# Patient Record
Sex: Male | Born: 2000 | Race: White | Hispanic: No | Marital: Single | Smoking: Never smoker
Health system: Southern US, Community
[De-identification: ages and names within clinical notes are randomized; demographics above are authoritative.]

---

## 2018-11-10 ENCOUNTER — Other Ambulatory Visit: Payer: Self-pay | Admitting: Internal Medicine

## 2018-11-10 DIAGNOSIS — Z20822 Contact with and (suspected) exposure to covid-19: Secondary | ICD-10-CM

## 2018-11-11 LAB — NOVEL CORONAVIRUS, NAA: SARS-CoV-2, NAA: NOT DETECTED

## 2018-12-21 ENCOUNTER — Other Ambulatory Visit: Payer: Self-pay

## 2018-12-21 DIAGNOSIS — Z20822 Contact with and (suspected) exposure to covid-19: Secondary | ICD-10-CM

## 2018-12-22 LAB — NOVEL CORONAVIRUS, NAA: SARS-CoV-2, NAA: NOT DETECTED

## 2019-12-06 ENCOUNTER — Encounter: Payer: Self-pay | Admitting: Emergency Medicine

## 2019-12-06 ENCOUNTER — Emergency Department: Payer: 59

## 2019-12-06 ENCOUNTER — Emergency Department
Admission: EM | Admit: 2019-12-06 | Discharge: 2019-12-06 | Disposition: A | Discharge status: Home / Self Care | Payer: 59 | Attending: Emergency Medicine | Admitting: Emergency Medicine

## 2019-12-06 ENCOUNTER — Other Ambulatory Visit: Payer: Self-pay

## 2019-12-06 DIAGNOSIS — X58XXXA Exposure to other specified factors, initial encounter: Secondary | ICD-10-CM | POA: Insufficient documentation

## 2019-12-06 DIAGNOSIS — S93402A Sprain of unspecified ligament of left ankle, initial encounter: Secondary | ICD-10-CM

## 2019-12-06 DIAGNOSIS — S99912A Unspecified injury of left ankle, initial encounter: Secondary | ICD-10-CM | POA: Diagnosis present

## 2019-12-06 MED ORDER — HYDROCODONE-ACETAMINOPHEN 5-325 MG PO TABS: 1.0000 | ORAL_TABLET | Freq: Four times a day (QID) | ORAL | 0 refills | Status: AC | PRN

## 2019-12-06 MED ORDER — HYDROCODONE-ACETAMINOPHEN 5-325 MG PO TABS
1.0000 | ORAL_TABLET | Freq: Once | ORAL | Status: AC
  Administered 2019-12-06: 1 via ORAL
  Filled 2019-12-06: qty 1

## 2019-12-06 NOTE — ED Notes (Signed)
See triage note  Presents with left ankle pain  States he did this yesterday   Was seen  Given Celebrex  But having increased pain  States he has an appt with Wonda Amis today

## 2019-12-06 NOTE — ED Provider Notes (Signed)
[facility] Emergency Department Provider Note  ____________________________________________   None    (approximate)  I have reviewed the triage vital signs and the nursing notes.   HISTORY  Chief Complaint Ankle Injury   HPI Nathan Hinton is a 19 y.o. male presents to the ED with complaint of left ankle pain.  Patient states that he was seen yesterday in [place] and diagnosed with a closed bimalleolar ankle fracture and prescribed Celebrex and Tylenol which has not given him any relief of his pain.  He walks with the aid of crutches and was put in a cam walker.  Patient denies any head injury or loss of consciousness during his injury.  This occurred while rollerblading.      History reviewed. No pertinent past medical history.  There are no problems to display for this patient.   History reviewed. No pertinent surgical history.  Prior to Admission medications   Medication Sig Start Date End Date Taking? Authorizing Provider  HYDROcodone-acetaminophen (NORCO/VICODIN) 5-325 MG tablet Take 1 tablet by mouth every 6 (six) hours as needed for moderate pain. 12/06/19   Tommi Rumps, PA-C    Allergies Patient has no known allergies.  No family history on file.  Social History Social History   Tobacco Use  . Smoking status: Never Smoker  . Smokeless tobacco: Never Used  Vaping Use  . Vaping Use: Never used  Substance Use Topics  . Alcohol use: Not on file  . Drug use: Not on file    Review of Systems Constitutional: No fever/chills Eyes: No visual changes. ENT: No sore throat. Cardiovascular: Denies chest pain. Respiratory: Denies shortness of breath. Gastrointestinal: No abdominal pain.  No nausea, no vomiting.  No diarrhea.  No constipation. Genitourinary: Negative for dysuria. Musculoskeletal: Negative for back pain. Skin: Negative for rash. Neurological: Negative for headaches, focal weakness or  numbness. ____________________________________________   PHYSICAL EXAM:  VITAL SIGNS: ED Triage Vitals  Enc Vitals Group     BP 12/06/19 0552 138/85     Pulse Rate 12/06/19 0552 80     Resp 12/06/19 0552 18     Temp 12/06/19 0552 98 F (36.7 C)     Temp Source 12/06/19 0552 Oral     SpO2 12/06/19 0552 98 %     Weight 12/06/19 0551 150 lb (68 kg)     Height 12/06/19 0551 5\' 11"  (1.803 m)     Head Circumference --      Peak Flow --      Pain Score 12/06/19 0552 8     Pain Loc --      Pain Edu? --      Excl. in GC? --     Constitutional: Alert and oriented. Well appearing and in no acute distress. Eyes: Conjunctivae are normal.  Head: Atraumatic. Neck: No stridor.   Cardiovascular: Normal rate, regular rhythm. Grossly normal heart sounds.  Good peripheral circulation. Respiratory: Normal respiratory effort.  No retractions. Lungs CTAB. Gastrointestinal: Soft and nontender. No distention. Musculoskeletal: On examination of the left ankle there is marked soft tissue edema bilaterally with ecchymosis.  Skin is intact.  Pulses present.  Motor sensory function intact distal to the injury.  Patient was placed in a cam walker which was removed for examination.  CAM Walker was replaced back on with instructions to ice and elevate.  Ice was applied to the ankle while waiting for x-rays. Neurologic:  Normal speech and language. No gross focal neurologic deficits are appreciated.  Skin:  Skin is warm, dry and intact.  Psychiatric: Mood and affect are normal. Speech and behavior are normal.  ____________________________________________   LABS (all labs ordered are listed, but only abnormal results are displayed)  Labs Reviewed - No data to display RADIOLOGY   Official radiology report(s): DG Ankle Complete Left  Result Date: 12/06/2019 CLINICAL DATA:  Left ankle injury. EXAM: LEFT ANKLE COMPLETE - 3+ VIEW COMPARISON:  No prior. FINDINGS: Diffuse soft tissue swelling. No acute bony  abnormality. No evidence of fracture dislocation. IMPRESSION: Diffuse soft tissue swelling. No acute bony abnormality. Electronically Signed   By: Maisie Fus  Register   On: 12/06/2019 07:32    ____________________________________________   PROCEDURES  Procedure(s) performed (including Critical Care):  Procedures   ____________________________________________   INITIAL IMPRESSION / ASSESSMENT AND PLAN / ED COURSE  As part of my medical decision making, I reviewed the following data within the electronic MEDICAL RECORD NUMBER Notes from prior ED visits and [place] Controlled Substance Database  19 year old male presents to the ED with concerns for pain medication as he was seen yesterday for an ankle injury.  He was told that he had a bimalleolar fracture and has an appointment today with [facility] in [place].  He is under the impression that he will be having surgery.  X-rays in the ED today did not show any fractures but moderate soft tissue edema.  While waiting for x-rays patient's ankle was elevated and ice was applied.  He was given hydrocodone with acetaminophen while waiting for the results as his mother was picking him up.  A prescription for hydrocodone was sent to the pharmacy.  He is encouraged to keep his appointment at [facility] today.  ____________________________________________   FINAL CLINICAL IMPRESSION(S) / ED DIAGNOSES  Final diagnoses:  Moderate ankle sprain, left, initial encounter     ED Discharge Orders         Ordered    HYDROcodone-acetaminophen (NORCO/VICODIN) 5-325 MG tablet  Every 6 hours PRN        12/06/19 0818          *Please note:  Nathan Hinton was evaluated in Emergency Department on 12/06/2019 for the symptoms described in the history of present illness. He was evaluated in the context of the global COVID-19 pandemic, which necessitated consideration that the patient might be at risk for infection with the SARS-CoV-2 virus that causes  COVID-19. Institutional protocols and algorithms that pertain to the evaluation of patients at risk for COVID-19 are in a state of rapid change based on information released by regulatory bodies including the CDC and federal and state organizations. These policies and algorithms were followed during the patient's care in the ED.  Some ED evaluations and interventions may be delayed as a result of limited staffing during and the pandemic.*   Note:  This document was prepared using Dragon voice recognition software and may include unintentional dictation errors.    Tommi Rumps, PA-C 12/06/19 1218    Sharyn Creamer, MD 12/06/19 [phone]

## 2019-12-06 NOTE — Discharge Instructions (Addendum)
Keep your appointment today with [facility].  Continue using your crutches when walking.  Ice and elevate frequently to decrease swelling which will help with pain.  Continue taking the Celebrex that was prescribed for you yesterday.  A prescription for hydrocodone was sent to your pharmacy.  This is 1 tablet every 6 hours as needed for pain.  Be aware that this medication could cause drowsiness and increase your risk for further injury.

## 2019-12-06 NOTE — ED Triage Notes (Addendum)
Pt ambulatory to triage with aid of crutches,no difficulty or distress noted; reports dx closed bimallleolar ankle fx yesterday and having pain unrelieved by rx celebrex and tylenol

## 2020-01-09 ENCOUNTER — Ambulatory Visit (INDEPENDENT_AMBULATORY_CARE_PROVIDER_SITE_OTHER): Payer: 59 | Admitting: Psychology

## 2020-01-09 DIAGNOSIS — F4322 Adjustment disorder with anxiety: Secondary | ICD-10-CM | POA: Diagnosis not present

## 2020-01-23 ENCOUNTER — Ambulatory Visit (INDEPENDENT_AMBULATORY_CARE_PROVIDER_SITE_OTHER): Payer: 59 | Admitting: Psychology

## 2020-01-23 DIAGNOSIS — F4322 Adjustment disorder with anxiety: Secondary | ICD-10-CM | POA: Diagnosis not present

## 2020-02-06 ENCOUNTER — Ambulatory Visit (INDEPENDENT_AMBULATORY_CARE_PROVIDER_SITE_OTHER): Payer: 59 | Admitting: Psychology

## 2020-02-06 DIAGNOSIS — F4322 Adjustment disorder with anxiety: Secondary | ICD-10-CM

## 2020-03-10 ENCOUNTER — Ambulatory Visit: Payer: 59 | Admitting: Psychology

## 2022-05-19 IMAGING — CR DG ANKLE COMPLETE 3+V*L*
1 series · 3 of 3 positions shown · non-contrast
Comparison: No prior.

CLINICAL DATA: Left ankle injury.

EXAM:
LEFT ANKLE COMPLETE - 3+ VIEW

[Series 1: dg ankle complete left · 0.14mm/px · 3 of 3 slices shown]
[im 1/3]
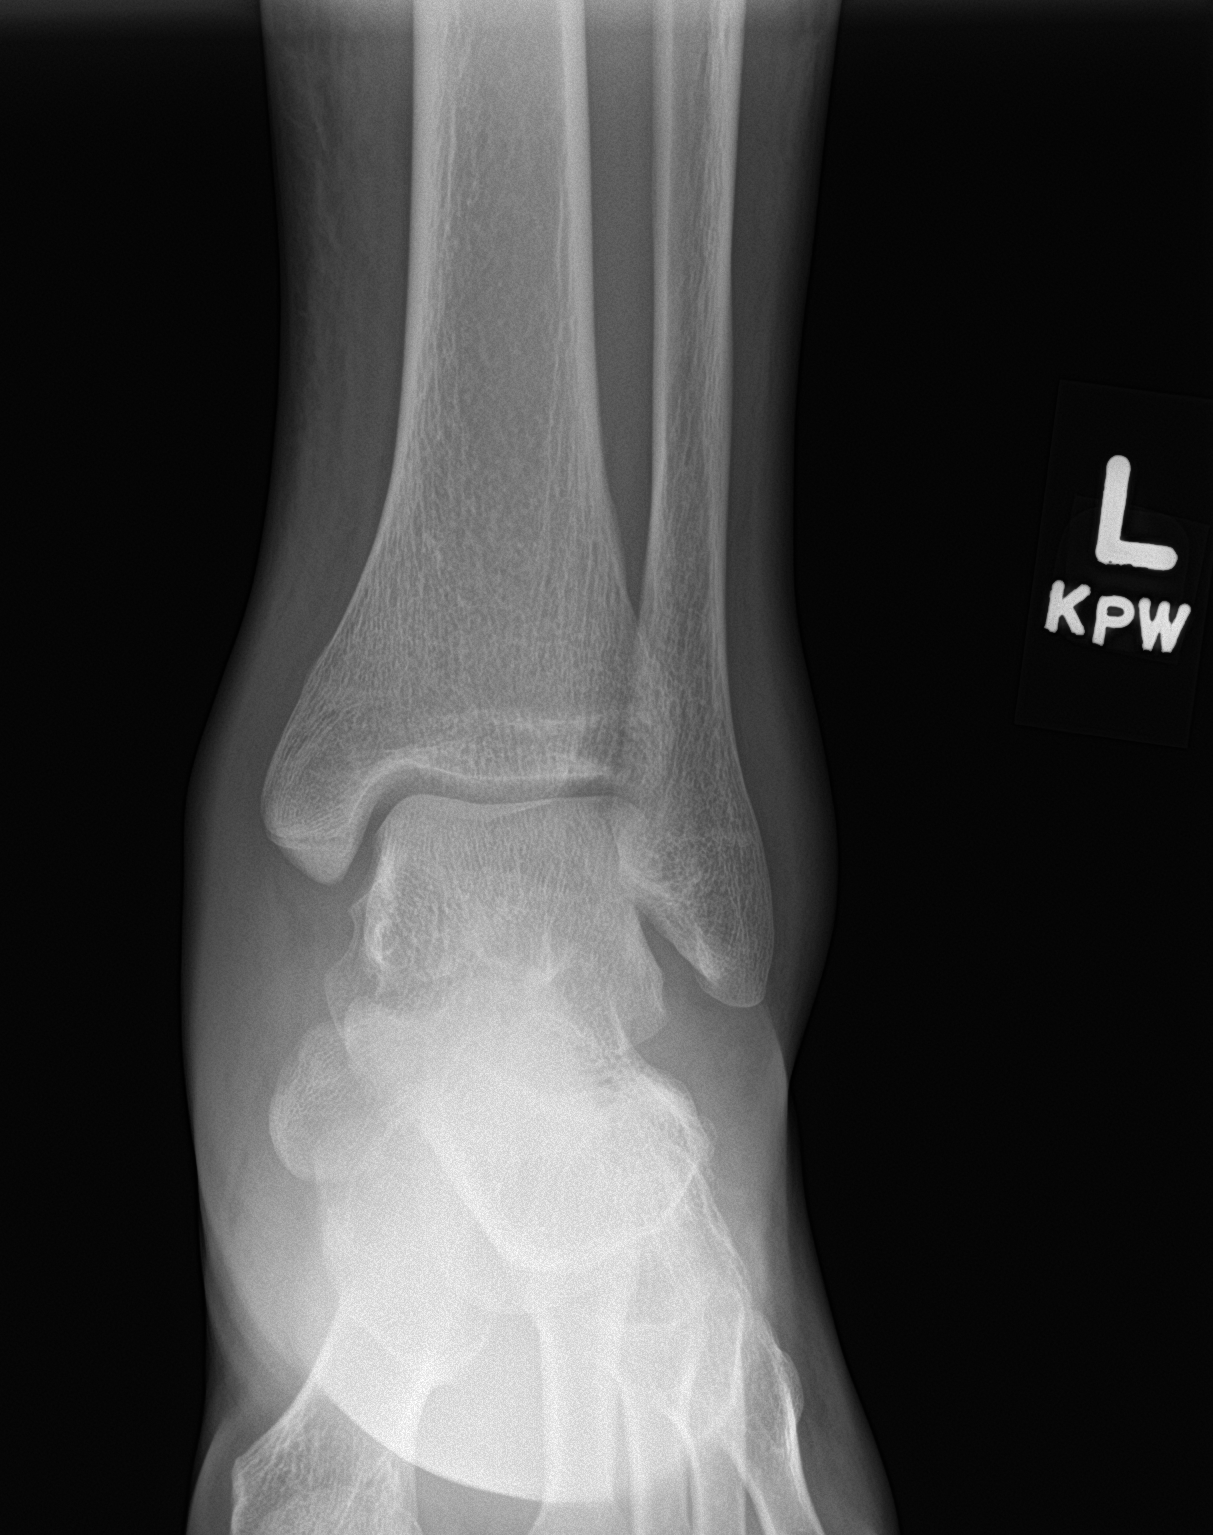
[im 2/3]
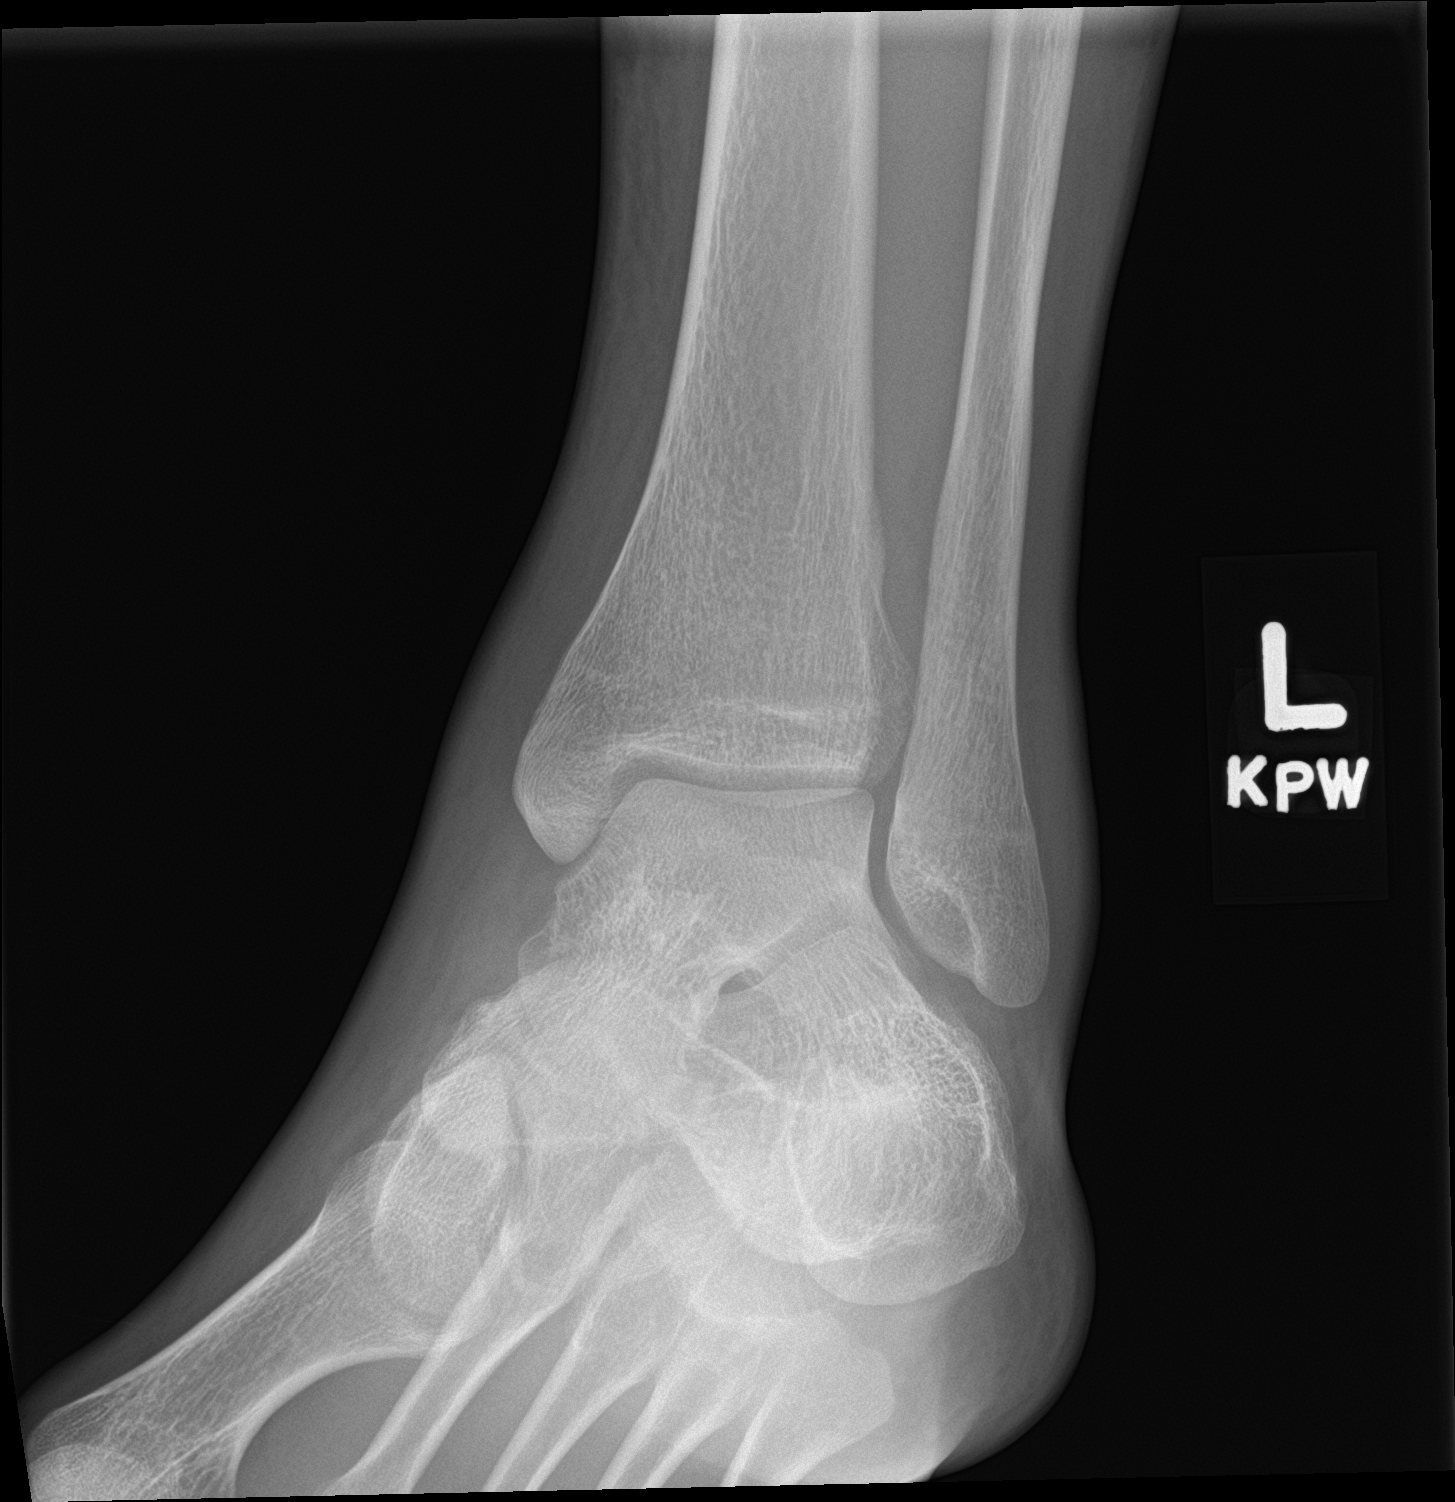
[im 3/3]
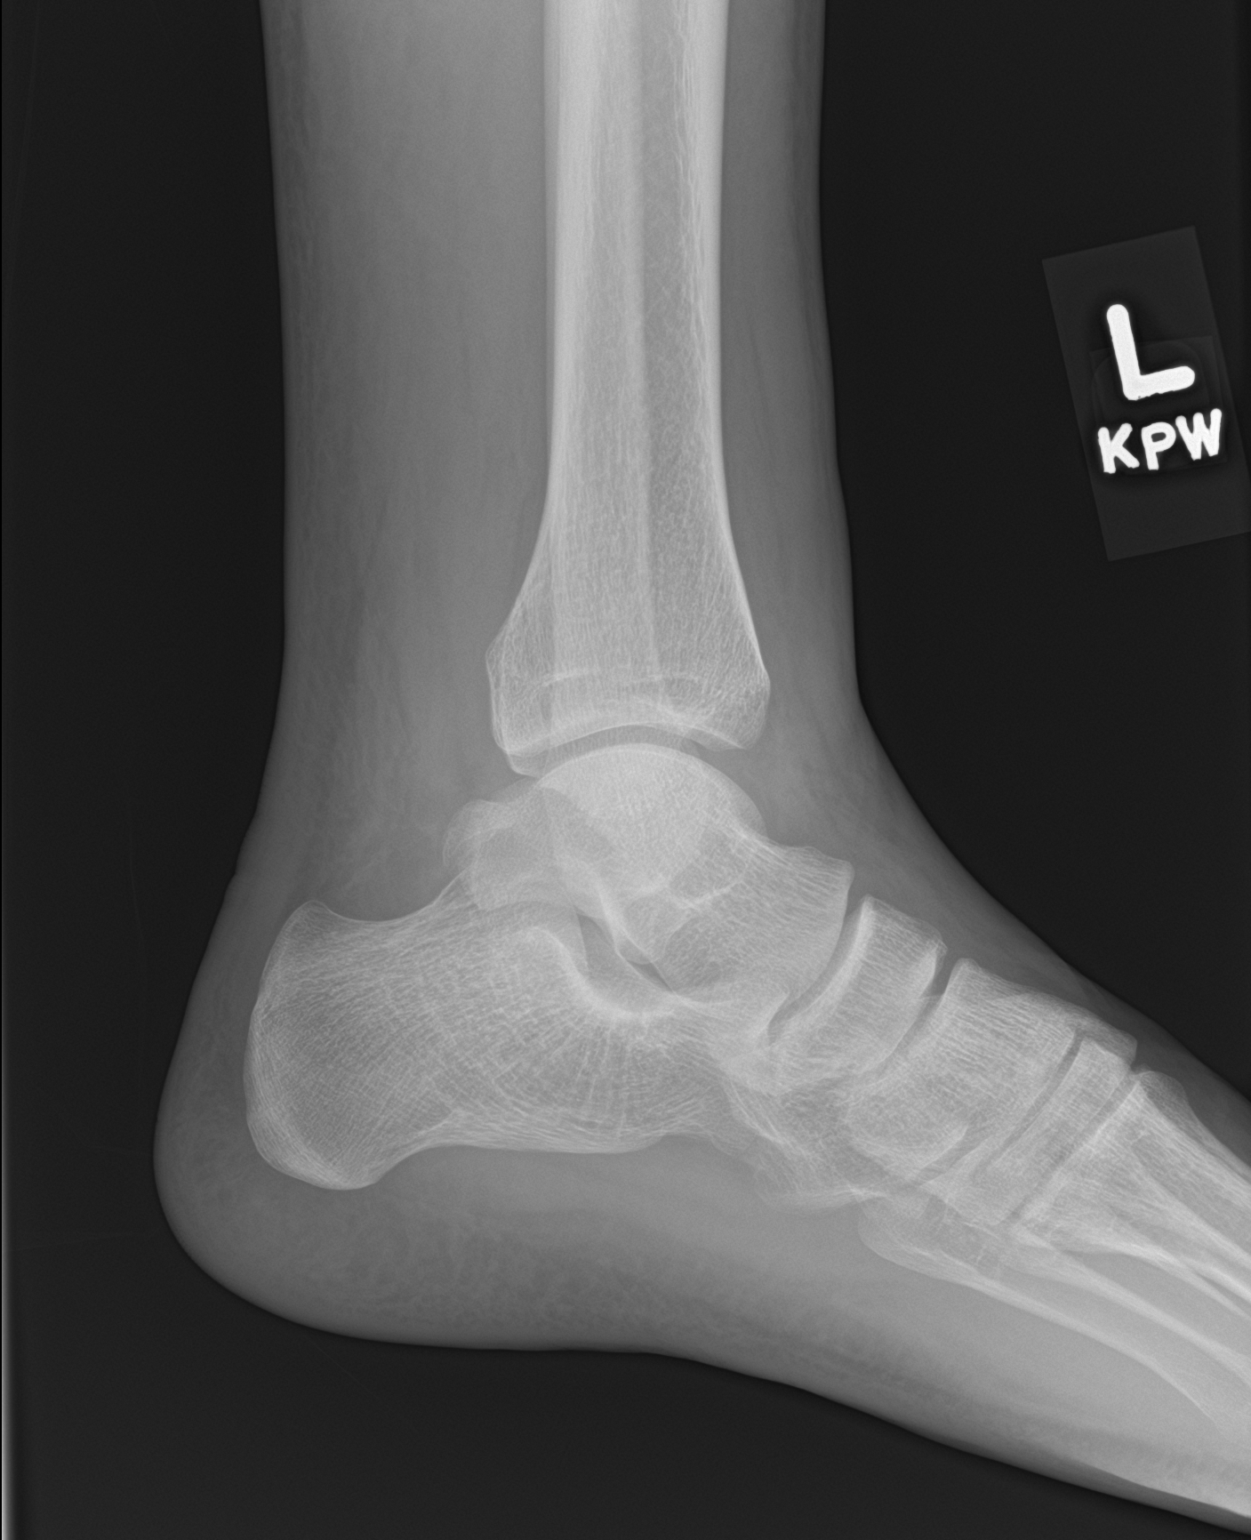

[3 of 3 positions shown; findings below may reference images not displayed]

FINDINGS: Diffuse soft tissue swelling. No acute bony abnormality. No evidence
of fracture dislocation.
IMPRESSION: Diffuse soft tissue swelling. No acute bony abnormality.
